# Patient Record
Sex: Female | Born: 1965
Health system: Southern US, Community
[De-identification: ages and names within clinical notes are randomized; demographics above are authoritative.]

---

## 2001-10-18 ENCOUNTER — Inpatient Hospital Stay (HOSPITAL_COMMUNITY): Admission: AD | Admit: 2001-10-18 | Discharge: 2001-10-20 | Payer: Self-pay | Admitting: Obstetrics and Gynecology

## 2001-10-21 ENCOUNTER — Encounter: Admission: RE | Admit: 2001-10-21 | Discharge: 2001-11-20 | Payer: Self-pay | Admitting: Obstetrics and Gynecology

## 2001-11-21 ENCOUNTER — Encounter: Admission: RE | Admit: 2001-11-21 | Discharge: 2001-12-21 | Payer: Self-pay | Admitting: Obstetrics and Gynecology

## 2003-01-02 ENCOUNTER — Other Ambulatory Visit: Admission: RE | Admit: 2003-01-02 | Discharge: 2003-01-02 | Payer: Self-pay | Admitting: Obstetrics and Gynecology

## 2004-04-13 ENCOUNTER — Other Ambulatory Visit: Admission: RE | Admit: 2004-04-13 | Discharge: 2004-04-13 | Payer: Self-pay | Admitting: Obstetrics and Gynecology

## 2005-06-30 ENCOUNTER — Other Ambulatory Visit: Admission: RE | Admit: 2005-06-30 | Discharge: 2005-06-30 | Payer: Self-pay | Admitting: Obstetrics and Gynecology

## 2005-07-19 ENCOUNTER — Emergency Department (HOSPITAL_COMMUNITY): Admission: EM | Admit: 2005-07-19 | Discharge: 2005-07-19 | Payer: Self-pay | Admitting: Family Medicine

## 2005-07-21 ENCOUNTER — Emergency Department (HOSPITAL_COMMUNITY): Admission: EM | Admit: 2005-07-21 | Discharge: 2005-07-21 | Payer: Self-pay | Admitting: Family Medicine

## 2005-11-05 ENCOUNTER — Emergency Department (HOSPITAL_COMMUNITY): Admission: EM | Admit: 2005-11-05 | Discharge: 2005-11-05 | Payer: Self-pay | Admitting: Family Medicine

## 2006-10-20 ENCOUNTER — Emergency Department (HOSPITAL_COMMUNITY): Admission: EM | Admit: 2006-10-20 | Discharge: 2006-10-20 | Payer: Self-pay | Admitting: Emergency Medicine

## 2007-01-18 ENCOUNTER — Emergency Department (HOSPITAL_COMMUNITY): Admission: EM | Admit: 2007-01-18 | Discharge: 2007-01-18 | Payer: Self-pay | Admitting: Emergency Medicine

## 2009-08-10 ENCOUNTER — Emergency Department (HOSPITAL_COMMUNITY): Admission: EM | Admit: 2009-08-10 | Discharge: 2009-08-10 | Payer: Self-pay | Admitting: Emergency Medicine

## 2010-03-17 ENCOUNTER — Emergency Department (HOSPITAL_COMMUNITY): Admission: EM | Admit: 2010-03-17 | Discharge: 2010-03-17 | Payer: Self-pay | Admitting: Family Medicine

## 2010-10-07 LAB — POCT RAPID STREP A (OFFICE): Streptococcus, Group A Screen (Direct): NEGATIVE

## 2010-12-09 NOTE — H&P (Signed)
[facility]  Patient:    Dawn Livingston, Dawn Livingston Visit Number: [id number] MRN: [id number]          Service Type: OBS Location: 910A 9131 01 Attending Physician:  Frederich Balding Dictated by:   Juluis Mire, M.D. Admit Date:  10/18/2001 Discharge Date: 10/20/2001                           History and Physical  CHIEF COMPLAINT:              The patient is a 45 year old gravida 3 para 1 abortus 1 married white female, who presents for repeat cesarean section.  HISTORY OF PRESENT ILLNESS:   The patients last menstrual period was January 15, 2002, giving her an estimated date of confinement of October 23, 2001.  This gives an estimated gestational age of [redacted] weeks.  This is consistent with early initial examination and prior ultrasound.                                The patients last pregnancy did result in a low transverse cesarean section for failure to progress.  We have discussed with her a trial of labor, which she has declined and now presents for repeat cesarean section.                                Other complicating issue, she was at risk for advanced maternal age.  She underwent amniocentesis with finding of chromosomal normal female.  She also had a positive group B strep with prior pregnancy, and some elevated blood pressures - although her blood pressure throughout this pregnancy has been normal.  ALLERGIES:                    No known drug allergies.  MEDICATIONS:                  Prenatal vitamins.  PAST MEDICAL HISTORY/FAMILY HISTORY/SOCIAL HISTORY:       Please see prenatal records.  REVIEW OF SYSTEMS:            Noncontributory.  PHYSICAL EXAMINATION:  VITAL SIGNS:                  The patient is afebrile with stable vital signs.  HEENT:                        Normocephalic.  PERRLA.  EOMI.  Sclerae and conjunctivae clear.  Oropharynx clear.  NECK:                         Without thyromegaly.  BREAST:                        Glandular but no discrete masses.  LUNGS:                        Clear.   CARDIAC:                     Regular rate with grade 2/6 systolic ejection murmur.  No clicks or gallops.  ABDOMEN:  Gravid uterus consistent with dates.  PELVIC:                       Cervix long and closed.  EXTREMITIES:                  Trace edema.  NEUROLOGIC:                   Deep tendon reflexes 2+, no clonus.  IMPRESSION:                   1. Intrauterine pregnancy at term with                                  prior cesarean section, desires repeat.                               2. Advanced maternal age with normal                                  amniocentesis studies.                               3. Positive group B streptococci with prior                                  pregnancy.  PLAN:                         The patient will undergo repeat cesarean section.  The risks of surgery have been discussed, including the risk of infection, the risk of hemorrhage that could necessitate transfusion with the risk of AIDS of hepatitis, the risk of injury to adjacent organs including bladder, bowel, or ureters that could require further exploratory surgery, the risk of deep vein thrombosis and pulmonary embolus.  The patient expressed understanding of the indications and risks. Dictated by:   Juluis Mire, M.D. Attending Physician:  Frederich Balding DD:  10/18/01 TD:  10/18/01 Job: [id number]

## 2010-12-09 NOTE — Op Note (Signed)
[facility]  Patient:    Dawn Livingston, Dawn Livingston Visit Number: [id number] MRN: [id number]          Service Type: OBS Location: 910A 9131 01 Attending Physician:  Frederich Balding Dictated by:   Juluis Mire, M.D. Proc. Date: 10/18/01 Admit Date:  10/18/2001                             Operative Report  PREOPERATIVE DIAGNOSIS:       Intrauterine pregnancy at term with prior cesarean section, desires a repeat.  POSTOPERATIVE DIAGNOSIS:      Intrauterine pregnancy at term with prior cesarean section, desires a repeat.  OPERATION:                    Repeat low transverse cesarean section.  SURGEON:                      Juluis Mire, M.D.  ASSISTANT:                    Trevor Iha, M.D.  ANESTHESIA:                   Spinal.  ESTIMATED BLOOD LOSS:         800 cc.  PACKS AND DRAINS:             None.  INTRAOPERATIVE BLOOD REPLACED: None.  COMPLICATIONS:                NONE.  INDICATIONS:                  Noted in history and physical.  DESCRIPTION OF PROCEDURE:     The patient was taken to the OR and placed in the supine position with left lateral tilt.  After satisfactory level of spinal anesthesia was obtained, the abdomen was prepped out with Betadine and draped in a sterile field.  A prior low transverse incision was identified and excised.  Incision was then extended to the subcutaneous tissue.  The fascia was entered sharply and incision was extended laterally.  Fascia was taken off the muscles superiorly and inferiorly.  Rectus muscles were separated in the midline.  The perineum was entered sharply and incision in the perineum extended both superiorly and inferiorly.  Low transverse bladder flap was delivered.  A low transverse uterine incision was done with a knife and extended laterally using manual traction.  The infant presented in the vertex presentation and delivered with elevation of the head and fundal pressure. The  infant was a viable female who weighed 7 pounds 6 ounces.  Apgars were 8/9.  Umbilical artery pH was 7.23.  There was a nuchal cord x1 that was easily reduced.  Amniotic fluid was clear.  Placenta was delivered manually.  The uterus was wiped free of remaining membranes and placenta.  The uterus was closed with running locking suture of 0 chromic using a two-layer closure technique.  We had good hemostasis.  Tubes and ovaries were unremarkable.  We irrigated the pelvis.  Hemostasis was excellent.  Urine output was clear and adequate.  At this point in time, muscles of the peritoneum was closed with running suture of 3-0 Vicryl.  The fascia was closed with running suture of 0 PDS.  Skin was closed with staples and Steri-Strips.  Sponge, instrument and needle counts were reported correct by the  circulating nurse x 2.  Foley catheter remained clear at the time of closure.  The patient did tolerate the procedure well and was returned to the recovery room in good condition. Dictated by:   Juluis Mire, M.D. Attending Physician:  Frederich Balding DD:  10/18/01 TD:  10/19/01 Job: [id number]

## 2010-12-09 NOTE — Discharge Summary (Signed)
[facility]  Patient:    Dawn Livingston, Dawn Livingston Visit Number: [id number] MRN: [id number]          Service Type: OBS Location: 910A 9131 01 Attending Physician:  Frederich Balding Dictated by:   Julio Sicks, N.P. Admit Date:  10/18/2001 Discharge Date: 10/20/2001                             Discharge Summary  ADMITTING DIAGNOSES:          1. Intrauterine pregnancy at term.                               2. Previous cesarean section delivery, desires                                  repeat.  DISCHARGE DIAGNOSIS:          Repeat low transverse cesarean section with                               viable female infant.  PROCEDURE:                    Repeat low transverse cesarean section.  REASON FOR ADMISSION:         Please see dictated H&P.  HOSPITAL COURSE:              The patient was admitted and taken to the operating room for the above-named procedure. A viable female infant weighing 7 pounds 6 ounces was delivered with Apgars of 8 at one minute and 9 at five minutes. Cord pH was 7.23. On postoperative day #1 the patient had a normal return of bowel function and was tolerating a liquid diet without complaints of nausea and vomiting. Hemoglobin was 11.0, hematocrit 32.6, WBCs of 9.2. On postoperative day #2 the patient was tolerating a regular diet, ambulating without assistance and voiding without difficulty. The patient requested discharged on that date.  CONDITION ON DISCHARGE:       Good.  DIET:                         Regular as tolerated.  ACTIVITY:                     No heavy lifting, no driving x two weeks, no vaginal entry.  DISCHARGE FOLLOWUP:           The patient is to follow up in the office in one to two weeks for an incision check. She is to call for temperature greater than 100 degrees, persistent nausea and vomiting, heavy vaginal bleeding, and/or redness or drainage from the incision site.  DISCHARGE MEDICATIONS:         1. Percocet 5/325.                               2. Prenatal vitamins.                               3. Sudafed p.r.n. Dictated by:   Julio Sicks, N.P. Attending Physician:  Frederich Balding  DD:  11/08/01 TD:  11/09/01 Job: [id number]

## 2011-05-10 LAB — I-STAT 8, (EC8 V) (CONVERTED LAB)
Acid-Base Excess: 1
BUN: 11
Chloride: 105
HCT: 46
Hemoglobin: 15.6 — ABNORMAL HIGH
Operator id: 282151
Potassium: 3.9
Sodium: 140

## 2011-05-10 LAB — TSH: TSH: 2.098

## 2011-07-12 ENCOUNTER — Encounter: Payer: Self-pay | Admitting: *Deleted

## 2011-07-12 ENCOUNTER — Emergency Department (INDEPENDENT_AMBULATORY_CARE_PROVIDER_SITE_OTHER)
Admission: EM | Admit: 2011-07-12 | Discharge: 2011-07-12 | Disposition: A | Discharge status: Home / Self Care | Payer: 59 | Source: Home / Self Care | Attending: Emergency Medicine | Admitting: Emergency Medicine

## 2011-07-12 DIAGNOSIS — L237 Allergic contact dermatitis due to plants, except food: Secondary | ICD-10-CM

## 2011-07-12 DIAGNOSIS — L255 Unspecified contact dermatitis due to plants, except food: Secondary | ICD-10-CM

## 2011-07-12 MED ORDER — METHYLPREDNISOLONE SODIUM SUCC 125 MG IJ SOLR
INTRAMUSCULAR | Status: AC
  Filled 2011-07-12: qty 2

## 2011-07-12 MED ORDER — BACITRACIN-POLYMYXIN B 500-10000 UNIT/GM EX OINT: TOPICAL_OINTMENT | Freq: Two times a day (BID) | CUTANEOUS | Status: AC

## 2011-07-12 MED ORDER — METHYLPREDNISOLONE SODIUM SUCC 125 MG IJ SOLR
125.0000 mg | Freq: Once | INTRAMUSCULAR | Status: AC
  Administered 2011-07-12: 125 mg via INTRAMUSCULAR

## 2011-07-12 MED ORDER — ZANFEL EX MISC: CUTANEOUS | Status: DC

## 2011-07-12 NOTE — ED Notes (Signed)
PT  HAS  RASH  ON  FACE   AND  HANDS        X   3  DAYS  HAS  HAD  REACTIONS  TO POISON IVY IN PAST        THE PT  HAS  BEEN APPLYING  CALAMINE  TO THE  AREA         AND  USING OTC  MEDS  WITHOUT  RELEIF

## 2011-07-12 NOTE — ED Provider Notes (Signed)
History     CSN: [id number] Arrival date & time: 07/12/2011  9:50 AM   First MD Initiated Contact with Patient 07/12/11 1035      Chief Complaint  Patient presents with  . Rash    HPI Comments: Pt with itchy red rash with blisters  On left hand, neck, face x 2 days after working out in yard. States is identical to prev episodes of poison ivy.   Patient is a 45 y.o. female presenting with rash. The history is provided by the patient.  Rash  This is a new problem. The current episode started more than 2 days ago. The problem is associated with plant contact. There has been no fever. The rash is present on the face, left hand and neck. The patient is experiencing no pain. Associated symptoms include itching and weeping. Pertinent negatives include no pain. She has tried antihistamines (calamaine lotion) for the symptoms. The treatment provided mild relief.    History reviewed. No pertinent past medical history.  Past Surgical History  Procedure Date  . Cesarean section     Family History  Problem Relation Age of Onset  . Diabetes Mother   . Hypertension Mother   . Diabetes Father   . Hypertension Father     History  Substance Use Topics  . Smoking status: Never Smoker   . Smokeless tobacco: Not on file  . Alcohol Use: No    OB History    Grav Para Term Preterm Abortions TAB SAB Ect Mult Living                  Review of Systems  Constitutional: Negative for fever.  HENT: Negative for trouble swallowing and voice change.   Respiratory: Negative for shortness of breath and wheezing.   Gastrointestinal: Negative for nausea and vomiting.  Skin: Positive for itching and rash.    Allergies  Review of patient's allergies indicates no known allergies.  Home Medications   Current Outpatient Rx  Name Route Sig Dispense Refill  . CALAMINE EX LOTN Topical Apply topically as needed.      Marland Kitchen CETIRIZINE HCL 10 MG PO TABS Oral Take 10 mg by mouth daily.      Marland Kitchen  BACITRACIN-POLYMYXIN B 500-10000 UNIT/GM EX OINT Topical Apply topically 2 (two) times daily. 30 g 0  . ZANFEL EX MISC  Apply per directions 30 g 0    BP 150/74  Pulse 68  Temp(Src) 98.3 F (36.8 C) (Oral)  Resp 18  SpO2 100%  LMP 07/10/2011  Physical Exam  Nursing note and vitals reviewed. Constitutional: She is oriented to person, place, and time. She appears well-developed and well-nourished. No distress.  HENT:  Head: Normocephalic and atraumatic.  Eyes: EOM are normal. Pupils are equal, round, and reactive to light.  Neck: Normal range of motion.  Cardiovascular: Regular rhythm.   Pulmonary/Chest: Effort normal and breath sounds normal.  Abdominal: She exhibits no distension.  Musculoskeletal: Normal range of motion.  Neurological: She is alert and oriented to person, place, and time.  Skin: Skin is warm and dry.       Vesicular linear rash with crusting over L hand, wrist, face, scattered lesions on neck. Erythematous urticaria under breasts that started this am per pt. No blisters.   Psychiatric: She has a normal mood and affect. Her behavior is normal. Judgment and thought content normal.    ED Course  Procedures (including critical care time)  Labs Reviewed - No data to display  No results found.   1. Contact dermatitis due to poison ivy       MDM  Giving solumedrol here sending home with zanfel and bacitracin will have continue zyrtec.   Luiz Blare, MD 07/12/11 1128

## 2012-07-09 ENCOUNTER — Other Ambulatory Visit (HOSPITAL_COMMUNITY)
Admission: RE | Admit: 2012-07-09 | Discharge: 2012-07-09 | Disposition: A | Discharge status: Home / Self Care | Payer: 59 | Source: Ambulatory Visit | Attending: Emergency Medicine | Admitting: Emergency Medicine

## 2012-07-09 ENCOUNTER — Encounter (HOSPITAL_COMMUNITY): Payer: Self-pay | Admitting: Emergency Medicine

## 2012-07-09 ENCOUNTER — Emergency Department (HOSPITAL_COMMUNITY)
Admission: EM | Admit: 2012-07-09 | Discharge: 2012-07-09 | Disposition: A | Discharge status: Home / Self Care | Payer: 59 | Source: Home / Self Care

## 2012-07-09 DIAGNOSIS — Z113 Encounter for screening for infections with a predominantly sexual mode of transmission: Secondary | ICD-10-CM | POA: Insufficient documentation

## 2012-07-09 DIAGNOSIS — R102 Pelvic and perineal pain: Secondary | ICD-10-CM

## 2012-07-09 DIAGNOSIS — N730 Acute parametritis and pelvic cellulitis: Secondary | ICD-10-CM

## 2012-07-09 DIAGNOSIS — N949 Unspecified condition associated with female genital organs and menstrual cycle: Secondary | ICD-10-CM

## 2012-07-09 DIAGNOSIS — N760 Acute vaginitis: Secondary | ICD-10-CM | POA: Insufficient documentation

## 2012-07-09 LAB — POCT URINALYSIS DIP (DEVICE)
Bilirubin Urine: NEGATIVE
Ketones, ur: NEGATIVE mg/dL
Leukocytes, UA: NEGATIVE
Nitrite: NEGATIVE
Protein, ur: NEGATIVE mg/dL
pH: 7 (ref 5.0–8.0)

## 2012-07-09 MED ORDER — LIDOCAINE HCL (PF) 1 % IJ SOLN
INTRAMUSCULAR | Status: AC
  Filled 2012-07-09: qty 5

## 2012-07-09 MED ORDER — AZITHROMYCIN 250 MG PO TABS
ORAL_TABLET | ORAL | Status: AC
  Filled 2012-07-09: qty 4

## 2012-07-09 MED ORDER — AZITHROMYCIN 250 MG PO TABS
1000.0000 mg | ORAL_TABLET | Freq: Every day | ORAL | Status: DC
  Administered 2012-07-09: 1000 mg via ORAL

## 2012-07-09 MED ORDER — CEFTRIAXONE SODIUM 250 MG IJ SOLR
INTRAMUSCULAR | Status: AC
  Filled 2012-07-09: qty 250

## 2012-07-09 MED ORDER — TRAMADOL HCL 50 MG PO TABS: 50.0000 mg | ORAL_TABLET | Freq: Four times a day (QID) | ORAL | Status: DC | PRN

## 2012-07-09 MED ORDER — CEFTRIAXONE SODIUM 250 MG IJ SOLR
250.0000 mg | Freq: Once | INTRAMUSCULAR | Status: AC
  Administered 2012-07-09: 250 mg via INTRAMUSCULAR

## 2012-07-09 NOTE — ED Notes (Signed)
Given graham crackers.

## 2012-07-09 NOTE — ED Provider Notes (Signed)
History     CSN: [id number]  Arrival date & time 07/09/12  1505   None     Chief Complaint  Patient presents with  . Abdominal Pain    (Consider location/radiation/quality/duration/timing/severity/associated sxs/prior treatment) HPI Comments: 46 year old female presents with acute pelvic pain that occurred upon awakening this morning. States had her norma/usual l bowel movement this morning but that did not change her discomfort. The pain is located across the lower pelvis but primarily in the suprapubic area. It is constant but worse with certain movements such as riding over bumps in the car. Sometimes it is better with standing. She denies associated urinary symptoms such as frequency, dysuria or urgency. She denies vaginal discharge, bleeding or vaginal pain. No nausea vomiting or diarrhea.  Her LMP was 2 weeks ago and right on time. Sexually active. Abdominal surgical history includes 2 C-sections. Denies sexual intercourse in the past 48 hours. She said nothing in the vagina for 2 weeks since her menses. She shaking, trembling and crying.   History reviewed. No pertinent past medical history.  Past Surgical History  Procedure Date  . Cesarean section     Family History  Problem Relation Age of Onset  . Diabetes Mother   . Hypertension Mother   . Diabetes Father   . Hypertension Father     History  Substance Use Topics  . Smoking status: Never Smoker   . Smokeless tobacco: Not on file  . Alcohol Use: No    OB History    Grav Para Term Preterm Abortions TAB SAB Ect Mult Living                  Review of Systems  Constitutional: Positive for activity change. Negative for fever, chills and fatigue.  HENT: Negative.   Respiratory: Negative.   Cardiovascular: Negative.   Gastrointestinal: Negative for nausea, vomiting, diarrhea, constipation, blood in stool, abdominal distention and rectal pain.  Genitourinary: Positive for pelvic pain. Negative for dysuria,  urgency, frequency, hematuria, flank pain, vaginal bleeding, vaginal discharge, difficulty urinating, vaginal pain and menstrual problem.  Musculoskeletal: Negative.   Neurological: Negative.     Allergies  Review of patient's allergies indicates no known allergies.  Home Medications   Current Outpatient Rx  Name  Route  Sig  Dispense  Refill  . CALAMINE EX LOTN   Topical   Apply topically as needed.           Marland Kitchen CETIRIZINE HCL 10 MG PO TABS   Oral   Take 10 mg by mouth daily.           Marland Kitchen ZANFEL EX MISC      Apply per directions   30 g   0   . TRAMADOL HCL 50 MG PO TABS   Oral   Take 1 tablet (50 mg total) by mouth every 6 (six) hours as needed for pain. Take one tablet every 4 hours as needed for pain   15 tablet   0     BP 167/93  Pulse 93  Temp 97.8 F (36.6 C) (Oral)  Resp 16  SpO2 100%  Physical Exam  Nursing note and vitals reviewed. Constitutional: She is oriented to person, place, and time. She appears well-developed and well-nourished.  Eyes: EOM are normal.  Neck: Normal range of motion. Neck supple.  Cardiovascular: Normal rate, regular rhythm and normal heart sounds.   Pulmonary/Chest: Effort normal and breath sounds normal. No respiratory distress. She has no wheezes. She has  no rales.  Abdominal: Soft. She exhibits no distension. There is no rebound and no guarding.       The abdomen is soft and nontender however external pelvic exam reveals guarding and severe tenderness with palpation. Patient states this could be in part due to a full bladder for she was told not to void prior to coming to the urgent care. Postvoiding exam reveals tenderness primarily to the suprapubic area. There is tenderness but less intense to the left pelvis. Palpating the external pelvis she is crying with pain and rates it a 7-8/10. No masses or hernia appreciated.   Musculoskeletal: Normal range of motion. She exhibits no edema.  Neurological: She is alert and oriented  to person, place, and time.  Skin: Skin is warm and dry. No erythema.    ED Course  Procedures (including critical care time)  Labs Reviewed  POCT URINALYSIS DIP (DEVICE) - Abnormal; Notable for the following:    Hgb urine dipstick MODERATE (*)     All other components within normal limits  POCT PREGNANCY, URINE  CERVICOVAGINAL ANCILLARY ONLY   No results found.   1. PID (acute pelvic inflammatory disease)   2. Pelvic pain in female       MDM   Results for orders placed during the hospital encounter of 07/09/12  POCT URINALYSIS DIP (DEVICE)      Component Value Range   Glucose, UA NEGATIVE  NEGATIVE mg/dL   Bilirubin Urine NEGATIVE  NEGATIVE   Ketones, ur NEGATIVE  NEGATIVE mg/dL   Specific Gravity, Urine <=1.005  1.005 - 1.030   Hgb urine dipstick MODERATE (*) NEGATIVE   pH 7.0  5.0 - 8.0   Protein, ur NEGATIVE  NEGATIVE mg/dL   Urobilinogen, UA 0.2  0.0 - 1.0 mg/dL   Nitrite NEGATIVE  NEGATIVE   Leukocytes, UA NEGATIVE  NEGATIVE  POCT PREGNANCY, URINE      Component Value Range   Preg Test, Ur NEGATIVE  NEGATIVE   Abdominal  exam is benign. Pelvic exam reveals tenderness over the external pelvis. There are no urinary symptoms. Since there is tenderness on the internal pelvic exam  this most likely represents PID. Rocephin 250 mg IM now Azithromycin 1 g by mouth now Af firm and GC Chlamydia DNA were obtained. Once results are back we will treat accordingly. Tramadol 50 mg every 4 hours when necessary pain. She was offered additional pain medicines however she declined. For any worsening, new symptoms, problems increased pain, bleeding, fever, chills, back pain vomiting or other symptoms go to the [facility] hospital or your gynecologist immediately.  07/10/12  2050h Followup telephone call to the patient. Patient states that she is feeling a little  better having to take ibuprofen for pain. She has not become any worse or develop any new symptoms. The test results  are back the GC/Chlamydia and af firm test were all negative. She has an appointment with her gynecologist tomorrow afternoon.        Hayden Rasmussen, NP 07/09/12 1609  Hayden Rasmussen, NP 07/09/12 1610  Hayden Rasmussen, NP 07/10/12 9604  Hayden Rasmussen, NP 07/10/12 2052

## 2012-07-09 NOTE — ED Notes (Signed)
C/o low abdominal pain, onset around 4 am today.  Pain never completely goes away, pain is constant.

## 2012-07-09 NOTE — ED Notes (Signed)
Patient aware of post injection delay.

## 2012-07-12 NOTE — ED Provider Notes (Signed)
Medical screening examination/treatment/procedure(s) were performed by resident physician or non-physician practitioner and as supervising physician I was immediately available for consultation/collaboration.   Fynn Vanblarcom DOUGLAS MD.    Dequavius Kuhner D Jerilynn Feldmeier, MD 07/12/12 1128 

## 2013-03-25 ENCOUNTER — Other Ambulatory Visit (HOSPITAL_COMMUNITY): Payer: Self-pay | Admitting: Internal Medicine

## 2013-03-25 DIAGNOSIS — R109 Unspecified abdominal pain: Secondary | ICD-10-CM

## 2013-03-27 ENCOUNTER — Ambulatory Visit (HOSPITAL_COMMUNITY)
Admission: RE | Admit: 2013-03-27 | Discharge: 2013-03-27 | Disposition: A | Discharge status: Home / Self Care | Payer: 59 | Source: Ambulatory Visit | Attending: Internal Medicine | Admitting: Internal Medicine

## 2013-03-27 DIAGNOSIS — R109 Unspecified abdominal pain: Secondary | ICD-10-CM | POA: Insufficient documentation

## 2013-03-27 DIAGNOSIS — K449 Diaphragmatic hernia without obstruction or gangrene: Secondary | ICD-10-CM | POA: Insufficient documentation

## 2014-05-20 ENCOUNTER — Other Ambulatory Visit: Payer: Self-pay | Admitting: Obstetrics and Gynecology

## 2014-05-22 LAB — CYTOLOGY - PAP

## 2016-01-10 DIAGNOSIS — Z01419 Encounter for gynecological examination (general) (routine) without abnormal findings: Secondary | ICD-10-CM | POA: Diagnosis not present

## 2016-01-10 DIAGNOSIS — Z1231 Encounter for screening mammogram for malignant neoplasm of breast: Secondary | ICD-10-CM | POA: Diagnosis not present

## 2016-01-10 DIAGNOSIS — Z6832 Body mass index (BMI) 32.0-32.9, adult: Secondary | ICD-10-CM | POA: Diagnosis not present

## 2016-01-12 DIAGNOSIS — L237 Allergic contact dermatitis due to plants, except food: Secondary | ICD-10-CM | POA: Diagnosis not present

## 2016-01-12 DIAGNOSIS — L0390 Cellulitis, unspecified: Secondary | ICD-10-CM | POA: Diagnosis not present

## 2016-01-12 MED FILL — CEPHALEXIN 500 MG CAPSULE: 500 | 10 days supply | Qty: 20 | Fill #0

## 2016-01-12 MED FILL — predniSONE 20 MG TABS: 20 | 9 days supply | Qty: 18 | Fill #0

## 2016-10-30 DIAGNOSIS — H524 Presbyopia: Secondary | ICD-10-CM | POA: Diagnosis not present

## 2017-09-04 DIAGNOSIS — Z01419 Encounter for gynecological examination (general) (routine) without abnormal findings: Secondary | ICD-10-CM | POA: Diagnosis not present

## 2017-09-04 DIAGNOSIS — Z6835 Body mass index (BMI) 35.0-35.9, adult: Secondary | ICD-10-CM | POA: Diagnosis not present

## 2017-09-04 DIAGNOSIS — Z1231 Encounter for screening mammogram for malignant neoplasm of breast: Secondary | ICD-10-CM | POA: Diagnosis not present

## 2017-10-09 ENCOUNTER — Encounter: Payer: Self-pay | Admitting: Obstetrics and Gynecology

## 2018-02-26 DIAGNOSIS — H524 Presbyopia: Secondary | ICD-10-CM | POA: Diagnosis not present

## 2018-09-23 DIAGNOSIS — Z8249 Family history of ischemic heart disease and other diseases of the circulatory system: Secondary | ICD-10-CM | POA: Diagnosis not present

## 2018-09-23 DIAGNOSIS — Z01419 Encounter for gynecological examination (general) (routine) without abnormal findings: Secondary | ICD-10-CM | POA: Diagnosis not present

## 2018-09-23 DIAGNOSIS — N951 Menopausal and female climacteric states: Secondary | ICD-10-CM | POA: Diagnosis not present

## 2018-09-23 DIAGNOSIS — Z833 Family history of diabetes mellitus: Secondary | ICD-10-CM | POA: Diagnosis not present

## 2018-09-23 DIAGNOSIS — Z13220 Encounter for screening for lipoid disorders: Secondary | ICD-10-CM | POA: Diagnosis not present

## 2018-09-23 DIAGNOSIS — Z13228 Encounter for screening for other metabolic disorders: Secondary | ICD-10-CM | POA: Diagnosis not present

## 2018-09-23 DIAGNOSIS — Z1329 Encounter for screening for other suspected endocrine disorder: Secondary | ICD-10-CM | POA: Diagnosis not present

## 2018-09-23 DIAGNOSIS — Z6835 Body mass index (BMI) 35.0-35.9, adult: Secondary | ICD-10-CM | POA: Diagnosis not present

## 2018-09-23 DIAGNOSIS — Z1231 Encounter for screening mammogram for malignant neoplasm of breast: Secondary | ICD-10-CM | POA: Diagnosis not present

## 2018-09-23 DIAGNOSIS — Z1321 Encounter for screening for nutritional disorder: Secondary | ICD-10-CM | POA: Diagnosis not present

## 2018-09-25 ENCOUNTER — Other Ambulatory Visit: Payer: Self-pay | Admitting: Obstetrics and Gynecology

## 2018-09-25 DIAGNOSIS — R928 Other abnormal and inconclusive findings on diagnostic imaging of breast: Secondary | ICD-10-CM

## 2018-09-30 ENCOUNTER — Ambulatory Visit
Admission: RE | Admit: 2018-09-30 | Discharge: 2018-09-30 | Disposition: A | Discharge status: Home / Self Care | Payer: 59 | Source: Ambulatory Visit | Attending: Obstetrics and Gynecology | Admitting: Obstetrics and Gynecology

## 2018-09-30 DIAGNOSIS — N6002 Solitary cyst of left breast: Secondary | ICD-10-CM | POA: Diagnosis not present

## 2018-09-30 DIAGNOSIS — R928 Other abnormal and inconclusive findings on diagnostic imaging of breast: Secondary | ICD-10-CM

## 2018-09-30 DIAGNOSIS — R922 Inconclusive mammogram: Secondary | ICD-10-CM | POA: Diagnosis not present

## 2018-11-05 DIAGNOSIS — E559 Vitamin D deficiency, unspecified: Secondary | ICD-10-CM | POA: Diagnosis not present

## 2018-11-13 ENCOUNTER — Telehealth: Payer: 59 | Admitting: Family

## 2018-11-13 DIAGNOSIS — L255 Unspecified contact dermatitis due to plants, except food: Secondary | ICD-10-CM | POA: Diagnosis not present

## 2018-11-13 MED ORDER — PREDNISONE 10 MG (21) PO TBPK: ORAL_TABLET | ORAL | 0 refills | Status: DC

## 2018-11-13 MED FILL — predniSONE 10 MG (21) TBPK: 10 | 6 days supply | Qty: 21 | Fill #0

## 2018-11-13 NOTE — Progress Notes (Signed)

## 2019-03-16 DIAGNOSIS — S61412A Laceration without foreign body of left hand, initial encounter: Secondary | ICD-10-CM | POA: Diagnosis not present

## 2019-03-16 DIAGNOSIS — W540XXA Bitten by dog, initial encounter: Secondary | ICD-10-CM | POA: Diagnosis not present

## 2019-03-18 MED FILL — ETODOLAC 400 MG TABS: 400 | 8 days supply | Qty: 12 | Fill #0

## 2019-03-22 MED FILL — CEFADROXIL 500 MG CAPSULE: 500 | 5 days supply | Qty: 10 | Fill #0

## 2019-03-27 DIAGNOSIS — S61411A Laceration without foreign body of right hand, initial encounter: Secondary | ICD-10-CM | POA: Diagnosis not present

## 2019-03-27 DIAGNOSIS — W540XXA Bitten by dog, initial encounter: Secondary | ICD-10-CM | POA: Diagnosis not present

## 2020-02-04 DIAGNOSIS — Z1231 Encounter for screening mammogram for malignant neoplasm of breast: Secondary | ICD-10-CM | POA: Diagnosis not present

## 2020-03-12 IMAGING — MG DIGITAL DIAGNOSTIC UNILATERAL LEFT MAMMOGRAM WITH TOMO AND CAD
4 series · 4 of 12 positions shown · non-contrast
Comparison: 09/04/2017 and earlier

CLINICAL DATA: The patient returns after screening study for
evaluation of possible LEFT breast asymmetry.

EXAM:
DIGITAL DIAGNOSTIC LEFT MAMMOGRAM WITH CAD AND TOMO
ULTRASOUND LEFT BREAST

[L CC synth-2D]
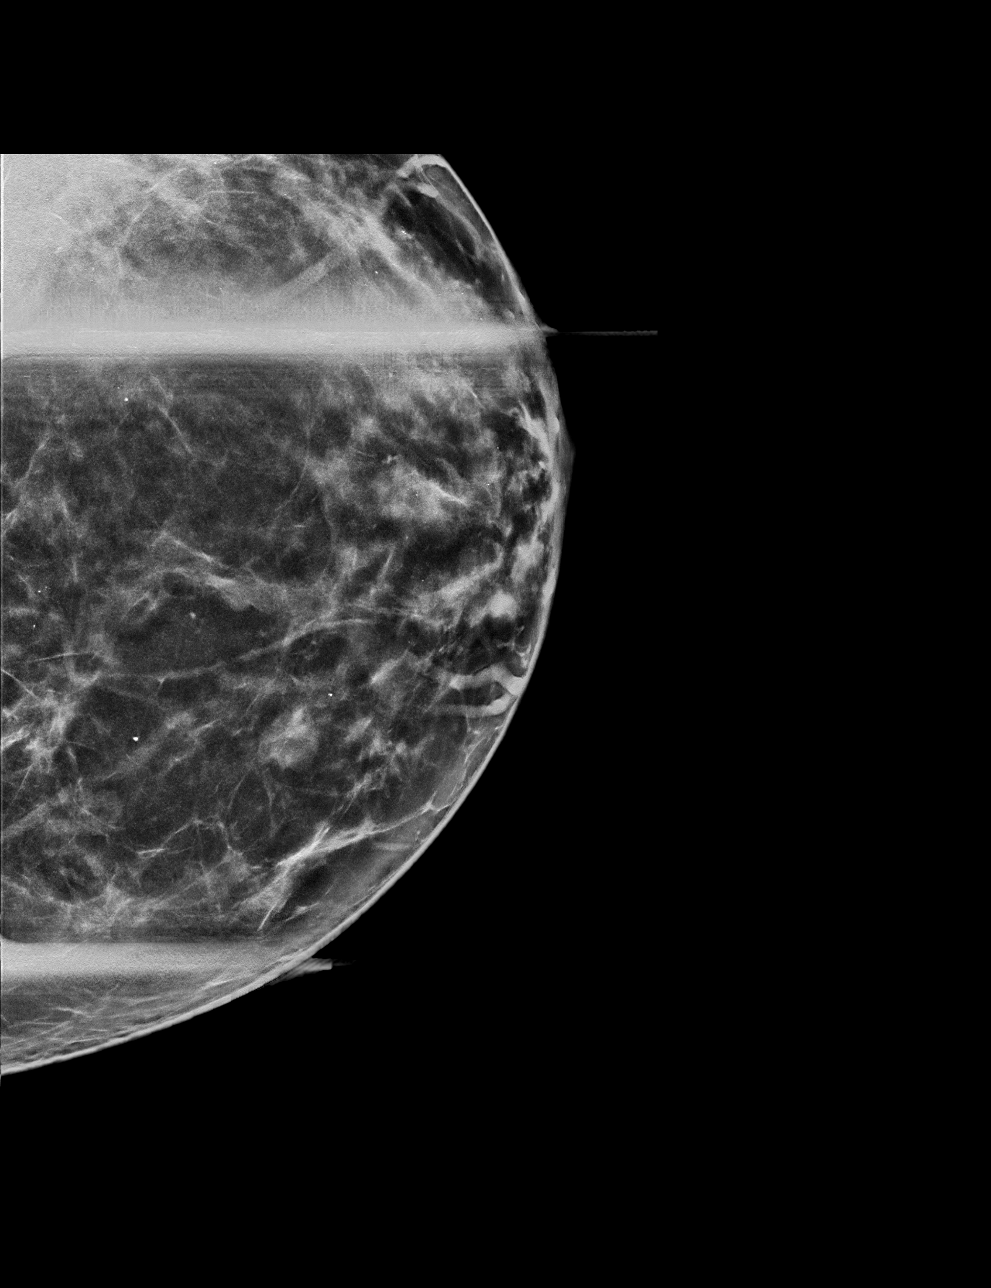

[L ML synth-2D]
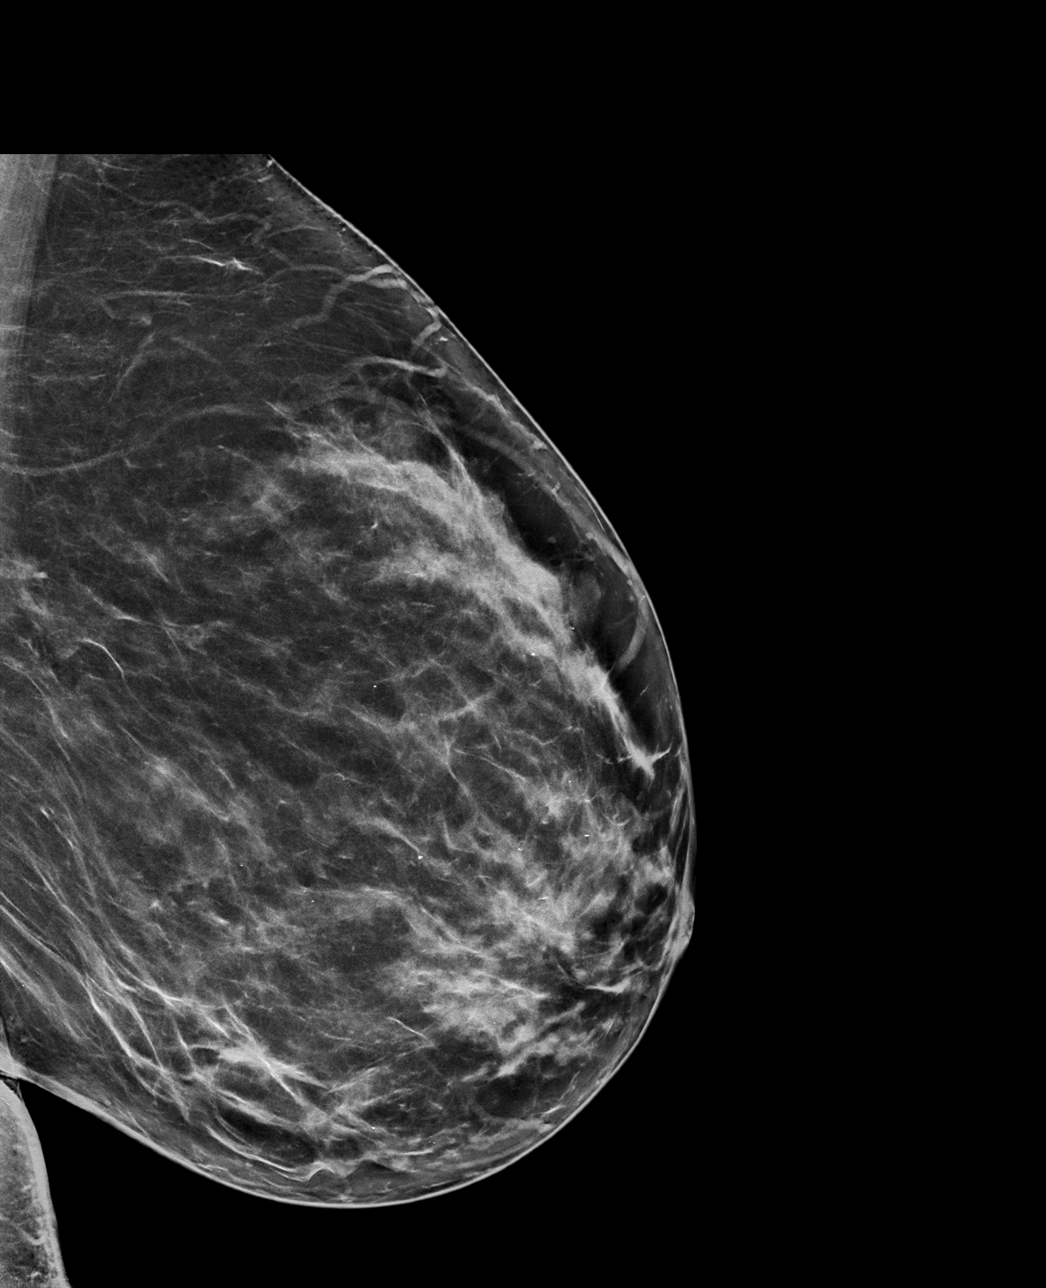

[L CC tomo · tomo slice 35/69.0]
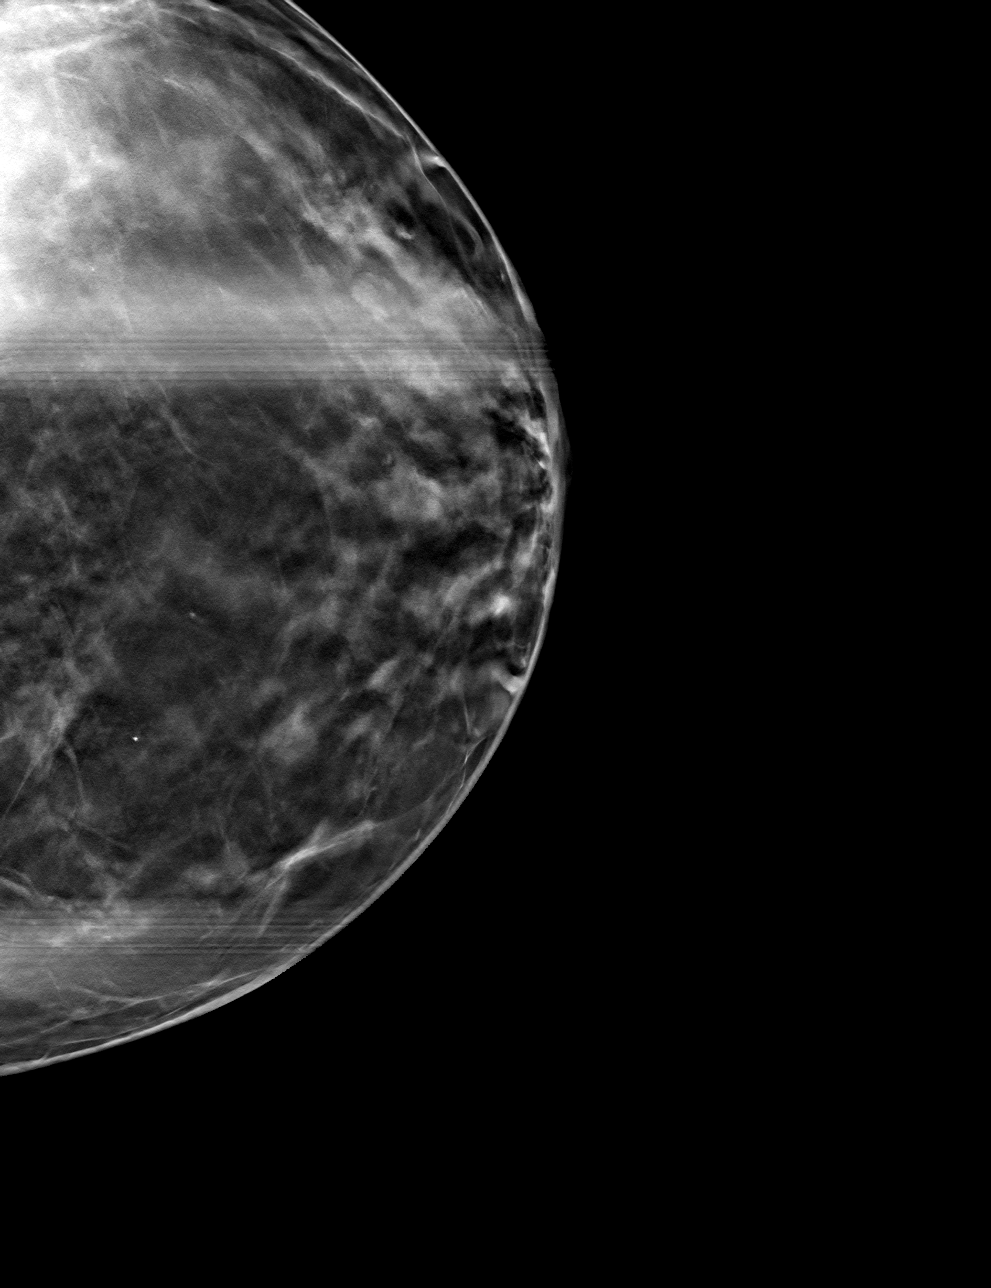

[L ML tomo · tomo slice 43/85.0]
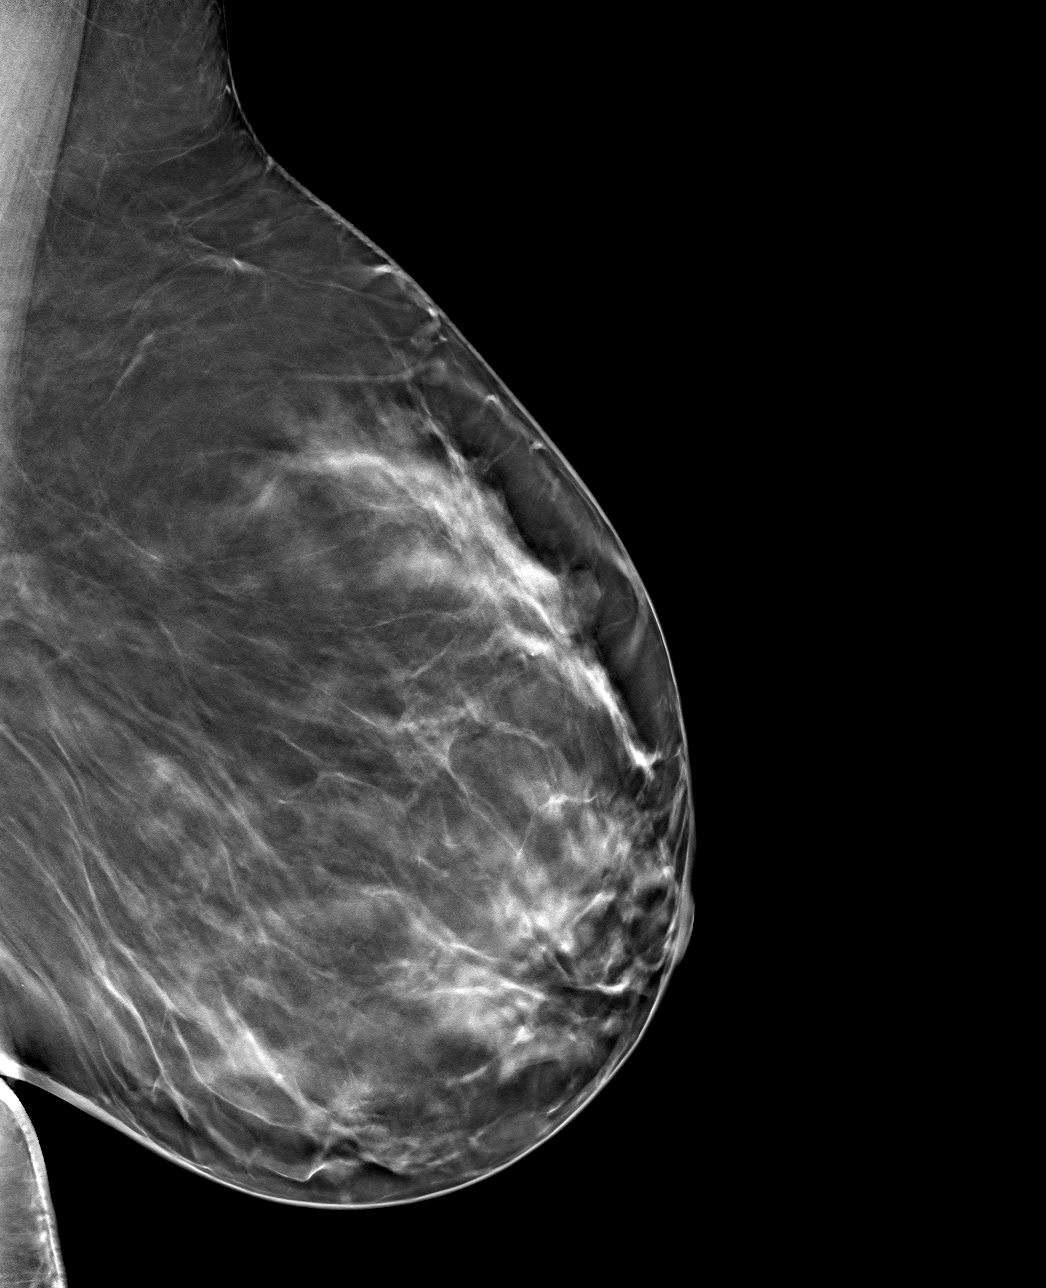

[4 of 12 positions shown; findings below may reference images not displayed]

ACR Breast Density Category c: The breast tissue is heterogeneously
dense, which may obscure small masses.
FINDINGS: Additional 2-D and 3-D images are performed. These views demonstrate
persistent circumscribed oval mass MEDIAL to LEFT nipple within the
LOWER INNER QUADRANT.

Mammographic images were processed with CAD.

Targeted ultrasound is performed, showing a circumscribed anechoic
oval mass in the 8 o'clock location of the LEFT breast 5 centimeters
from the nipple measuring 0.9 x 0.7 x 0.3 centimeters. A simple cyst
in the 12 o'clock location of the LEFT breast 4 centimeters from
nipple is 1.0 x 0.9 by 1.1 centimeters. No solid mass or areas of
acoustic shadowing.
IMPRESSION: Simple cysts within the LEFT breast, 1 of which accounts for the
mammographic abnormality. No mammographic or ultrasound evidence for
malignancy.

RECOMMENDATION:
Screening mammogram in one year.(Code:1W-[id number])

I have discussed the findings and recommendations with the patient
and her husband. Results were also provided in writing at the
conclusion of the visit. If applicable, a reminder letter will be
sent to the patient regarding the next appointment.

BI-RADS CATEGORY  2: Benign.

## 2020-03-12 IMAGING — US ULTRASOUND LEFT BREAST LIMITED
1 series · 13 of 15 positions shown · non-contrast
Comparison: 09/04/2017 and earlier

CLINICAL DATA: The patient returns after screening study for
evaluation of possible LEFT breast asymmetry.

EXAM:
DIGITAL DIAGNOSTIC LEFT MAMMOGRAM WITH CAD AND TOMO
ULTRASOUND LEFT BREAST

[Series 1: ultrasound left breast limited · 0.07mm/px · 13 of 15 slices shown]
[im 1/15]
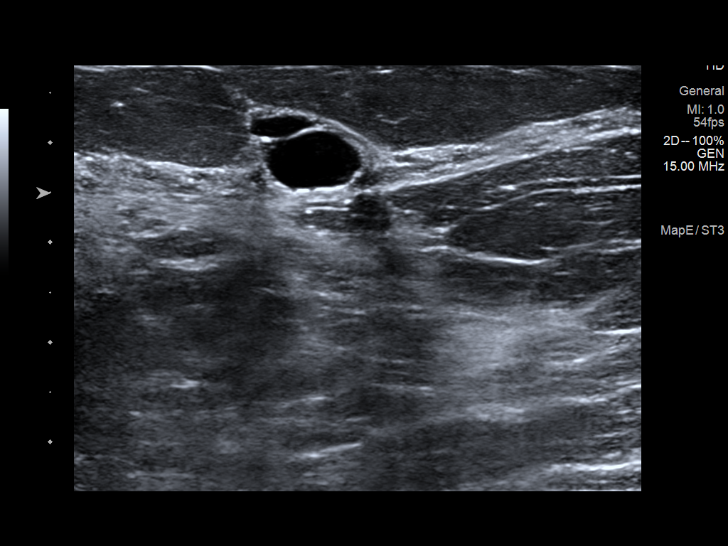
[im 2/15]
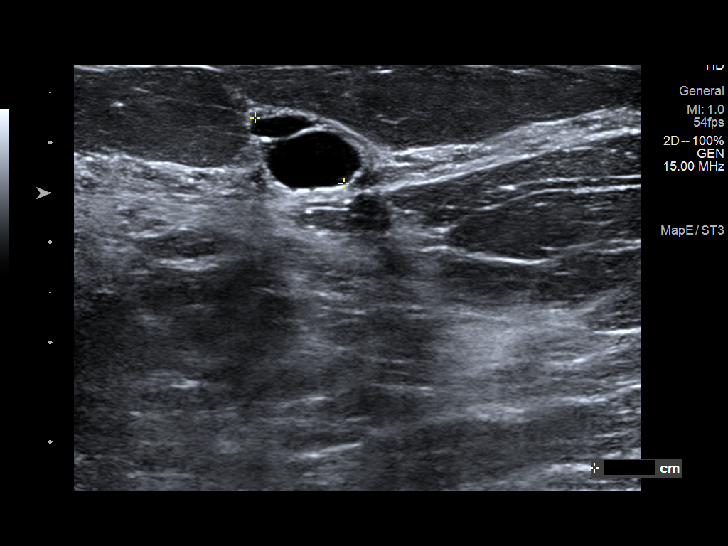
[im 3/15]
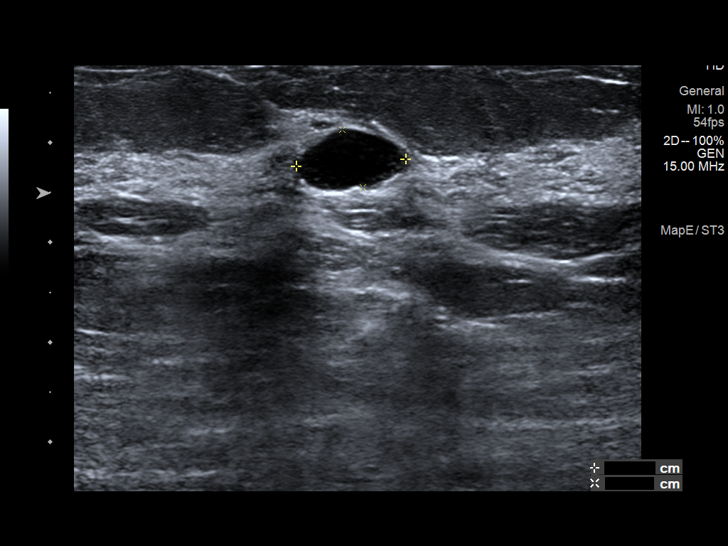
[im 5/15]
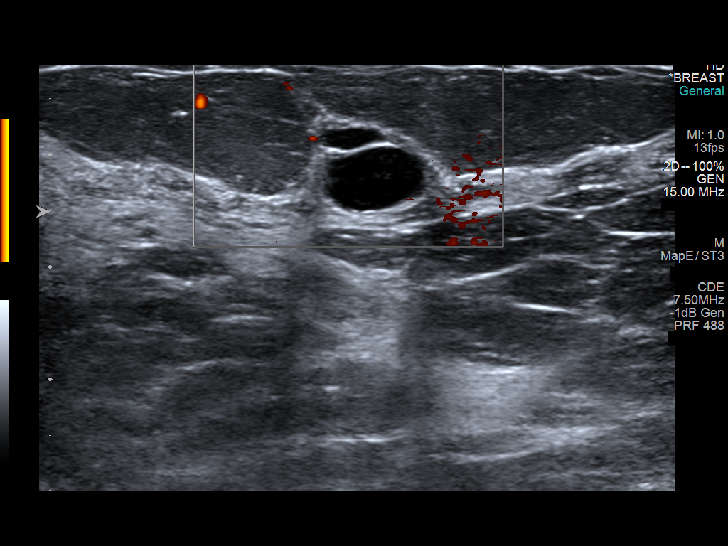
[im 6/15]
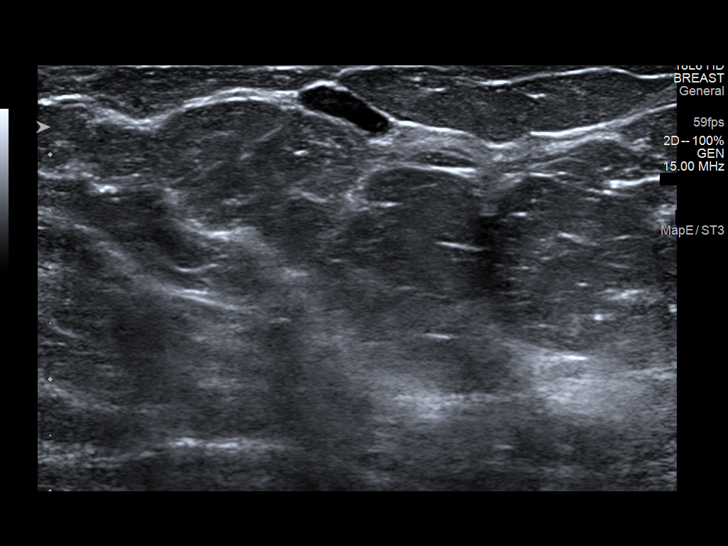
[im 7/15]
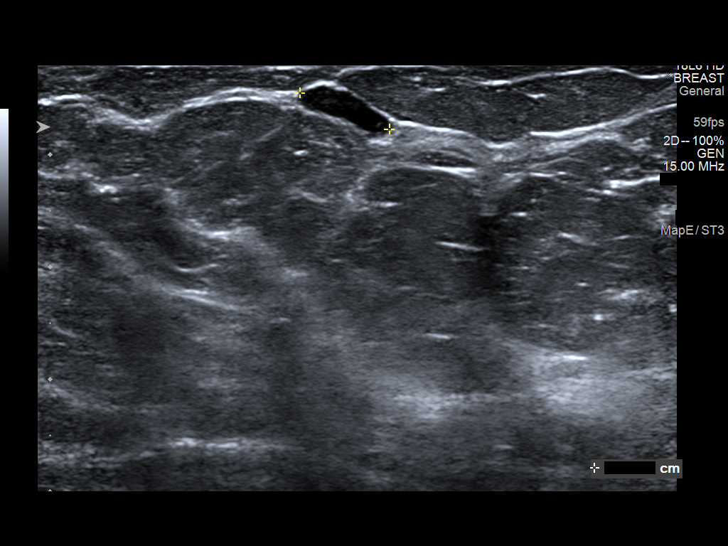
[im 8/15]
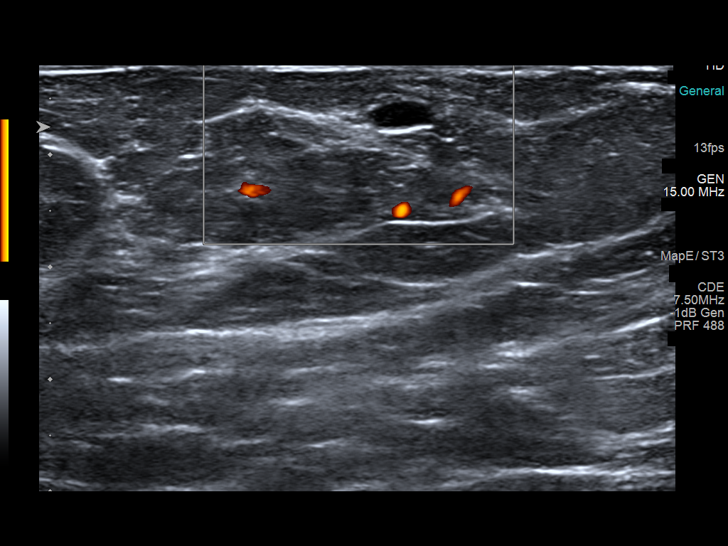
[im 9/15]
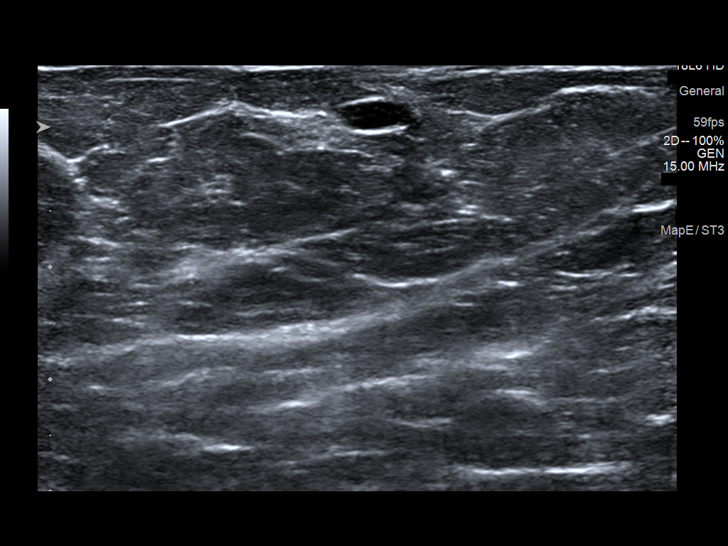
[im 10/15]
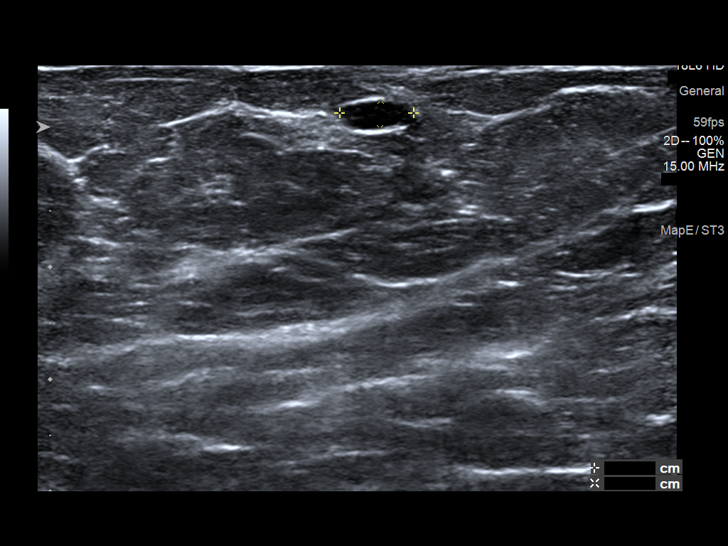
[im 11/15]
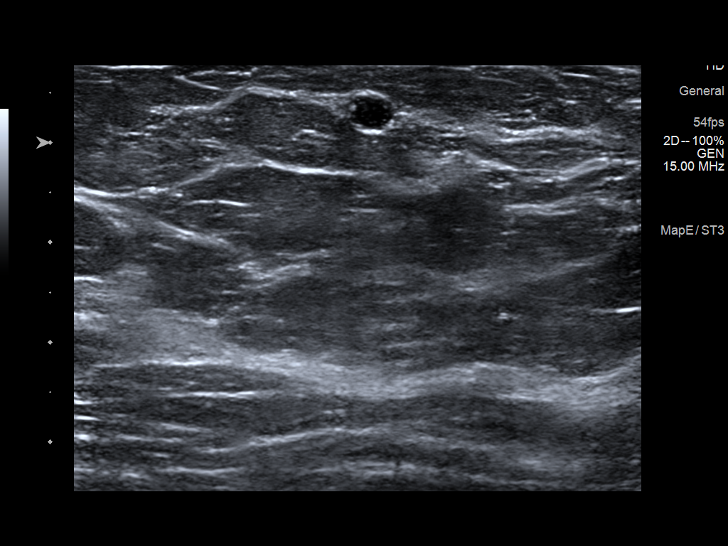
[im 13/15]
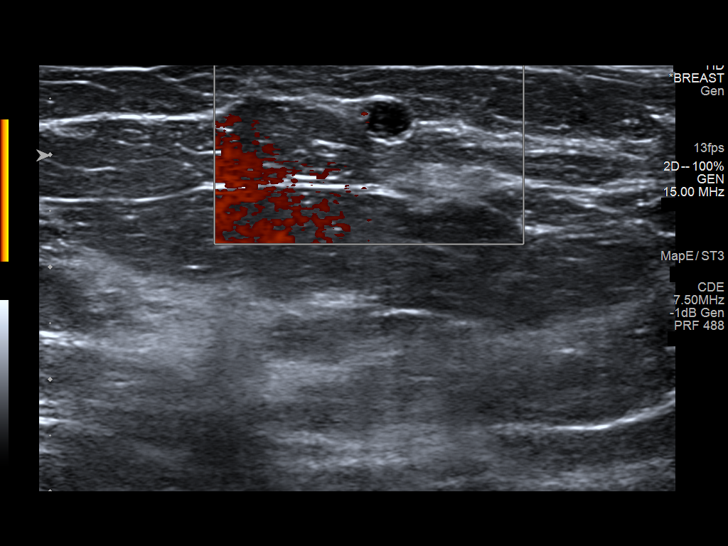
[im 14/15]
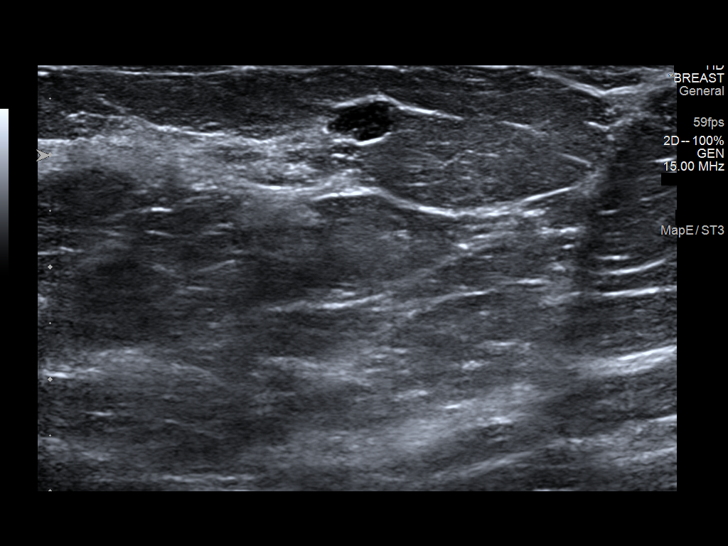
[im 15/15]
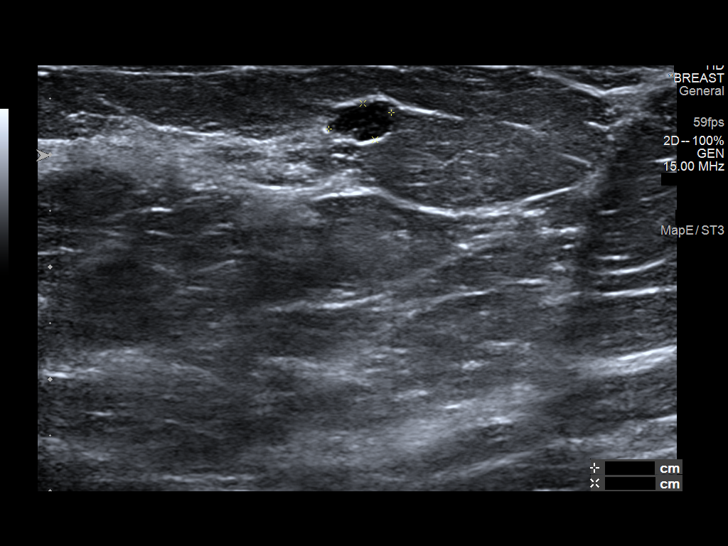

[13 of 15 positions shown; findings below may reference images not displayed]

ACR Breast Density Category c: The breast tissue is heterogeneously
dense, which may obscure small masses.
FINDINGS: Additional 2-D and 3-D images are performed. These views demonstrate
persistent circumscribed oval mass MEDIAL to LEFT nipple within the
LOWER INNER QUADRANT.

Mammographic images were processed with CAD.

Targeted ultrasound is performed, showing a circumscribed anechoic
oval mass in the 8 o'clock location of the LEFT breast 5 centimeters
from the nipple measuring 0.9 x 0.7 x 0.3 centimeters. A simple cyst
in the 12 o'clock location of the LEFT breast 4 centimeters from
nipple is 1.0 x 0.9 by 1.1 centimeters. No solid mass or areas of
acoustic shadowing.
IMPRESSION: Simple cysts within the LEFT breast, 1 of which accounts for the
mammographic abnormality. No mammographic or ultrasound evidence for
malignancy.

RECOMMENDATION:
Screening mammogram in one year.(Code:[id number])

I have discussed the findings and recommendations with the patient
and her husband. Results were also provided in writing at the
conclusion of the visit. If applicable, a reminder letter will be
sent to the patient regarding the next appointment.

BI-RADS CATEGORY  2: Benign.

## 2020-04-13 DIAGNOSIS — Z6832 Body mass index (BMI) 32.0-32.9, adult: Secondary | ICD-10-CM | POA: Diagnosis not present

## 2020-04-13 DIAGNOSIS — Z131 Encounter for screening for diabetes mellitus: Secondary | ICD-10-CM | POA: Diagnosis not present

## 2020-04-13 DIAGNOSIS — Z1321 Encounter for screening for nutritional disorder: Secondary | ICD-10-CM | POA: Diagnosis not present

## 2020-04-13 DIAGNOSIS — Z13220 Encounter for screening for lipoid disorders: Secondary | ICD-10-CM | POA: Diagnosis not present

## 2020-04-13 DIAGNOSIS — Z13228 Encounter for screening for other metabolic disorders: Secondary | ICD-10-CM | POA: Diagnosis not present

## 2020-04-13 DIAGNOSIS — Z1329 Encounter for screening for other suspected endocrine disorder: Secondary | ICD-10-CM | POA: Diagnosis not present

## 2020-04-13 DIAGNOSIS — Z01419 Encounter for gynecological examination (general) (routine) without abnormal findings: Secondary | ICD-10-CM | POA: Diagnosis not present

## 2021-03-23 ENCOUNTER — Other Ambulatory Visit (HOSPITAL_COMMUNITY): Payer: Self-pay

## 2021-03-23 DIAGNOSIS — L237 Allergic contact dermatitis due to plants, except food: Secondary | ICD-10-CM | POA: Diagnosis not present

## 2021-03-23 DIAGNOSIS — R030 Elevated blood-pressure reading, without diagnosis of hypertension: Secondary | ICD-10-CM | POA: Diagnosis not present

## 2021-03-23 MED ORDER — MUPIROCIN 2 % EX OINT
TOPICAL_OINTMENT | CUTANEOUS | 0 refills | Status: DC
  Filled 2021-03-23: qty 22, 14d supply, fill #0

## 2021-03-23 MED ORDER — HYDROCORTISONE 2.5 % EX CREA
TOPICAL_CREAM | CUTANEOUS | 0 refills | Status: DC
  Filled 2021-03-23: qty 30, 14d supply, fill #0

## 2021-03-23 MED ORDER — CEPHALEXIN 500 MG PO CAPS
ORAL_CAPSULE | ORAL | 0 refills | Status: DC
  Filled 2021-03-23: qty 14, 7d supply, fill #0

## 2021-05-31 DIAGNOSIS — Z1231 Encounter for screening mammogram for malignant neoplasm of breast: Secondary | ICD-10-CM | POA: Diagnosis not present

## 2021-06-07 DIAGNOSIS — Z1321 Encounter for screening for nutritional disorder: Secondary | ICD-10-CM | POA: Diagnosis not present

## 2021-06-07 DIAGNOSIS — Z1329 Encounter for screening for other suspected endocrine disorder: Secondary | ICD-10-CM | POA: Diagnosis not present

## 2021-06-07 DIAGNOSIS — Z13228 Encounter for screening for other metabolic disorders: Secondary | ICD-10-CM | POA: Diagnosis not present

## 2021-06-07 DIAGNOSIS — Z6834 Body mass index (BMI) 34.0-34.9, adult: Secondary | ICD-10-CM | POA: Diagnosis not present

## 2021-06-07 DIAGNOSIS — Z01419 Encounter for gynecological examination (general) (routine) without abnormal findings: Secondary | ICD-10-CM | POA: Diagnosis not present

## 2021-06-07 DIAGNOSIS — Z13220 Encounter for screening for lipoid disorders: Secondary | ICD-10-CM | POA: Diagnosis not present

## 2021-06-07 DIAGNOSIS — N951 Menopausal and female climacteric states: Secondary | ICD-10-CM | POA: Diagnosis not present

## 2021-06-07 DIAGNOSIS — Z8249 Family history of ischemic heart disease and other diseases of the circulatory system: Secondary | ICD-10-CM | POA: Diagnosis not present

## 2021-06-27 ENCOUNTER — Other Ambulatory Visit: Payer: Self-pay | Admitting: Obstetrics and Gynecology

## 2021-06-27 DIAGNOSIS — E785 Hyperlipidemia, unspecified: Secondary | ICD-10-CM

## 2021-08-10 DIAGNOSIS — E559 Vitamin D deficiency, unspecified: Secondary | ICD-10-CM | POA: Diagnosis not present

## 2021-12-29 DIAGNOSIS — H52223 Regular astigmatism, bilateral: Secondary | ICD-10-CM | POA: Diagnosis not present

## 2021-12-29 DIAGNOSIS — H5211 Myopia, right eye: Secondary | ICD-10-CM | POA: Diagnosis not present

## 2021-12-29 DIAGNOSIS — H5202 Hypermetropia, left eye: Secondary | ICD-10-CM | POA: Diagnosis not present

## 2022-08-31 DIAGNOSIS — Z124 Encounter for screening for malignant neoplasm of cervix: Secondary | ICD-10-CM | POA: Diagnosis not present

## 2022-08-31 DIAGNOSIS — Z13220 Encounter for screening for lipoid disorders: Secondary | ICD-10-CM | POA: Diagnosis not present

## 2022-08-31 DIAGNOSIS — Z1321 Encounter for screening for nutritional disorder: Secondary | ICD-10-CM | POA: Diagnosis not present

## 2022-08-31 DIAGNOSIS — Z131 Encounter for screening for diabetes mellitus: Secondary | ICD-10-CM | POA: Diagnosis not present

## 2022-08-31 DIAGNOSIS — Z6832 Body mass index (BMI) 32.0-32.9, adult: Secondary | ICD-10-CM | POA: Diagnosis not present

## 2022-08-31 DIAGNOSIS — Z1329 Encounter for screening for other suspected endocrine disorder: Secondary | ICD-10-CM | POA: Diagnosis not present

## 2022-08-31 DIAGNOSIS — Z01419 Encounter for gynecological examination (general) (routine) without abnormal findings: Secondary | ICD-10-CM | POA: Diagnosis not present

## 2022-08-31 DIAGNOSIS — Z13228 Encounter for screening for other metabolic disorders: Secondary | ICD-10-CM | POA: Diagnosis not present

## 2022-08-31 DIAGNOSIS — Z1151 Encounter for screening for human papillomavirus (HPV): Secondary | ICD-10-CM | POA: Diagnosis not present

## 2022-09-04 ENCOUNTER — Other Ambulatory Visit: Payer: Self-pay | Admitting: Obstetrics and Gynecology

## 2022-09-04 DIAGNOSIS — Z8249 Family history of ischemic heart disease and other diseases of the circulatory system: Secondary | ICD-10-CM

## 2022-09-08 ENCOUNTER — Other Ambulatory Visit (HOSPITAL_COMMUNITY): Payer: Self-pay

## 2022-09-12 DIAGNOSIS — E669 Obesity, unspecified: Secondary | ICD-10-CM | POA: Diagnosis not present

## 2022-09-12 DIAGNOSIS — Z1231 Encounter for screening mammogram for malignant neoplasm of breast: Secondary | ICD-10-CM | POA: Diagnosis not present

## 2022-09-12 DIAGNOSIS — Z1211 Encounter for screening for malignant neoplasm of colon: Secondary | ICD-10-CM | POA: Diagnosis not present

## 2022-09-28 DIAGNOSIS — Z13820 Encounter for screening for osteoporosis: Secondary | ICD-10-CM | POA: Diagnosis not present

## 2022-11-10 ENCOUNTER — Other Ambulatory Visit (HOSPITAL_COMMUNITY): Payer: Self-pay

## 2022-11-10 MED ORDER — GOLYTELY 236 G PO SOLR
ORAL | 0 refills | Status: DC
  Filled 2022-11-10: qty 4000, 1d supply, fill #0

## 2022-11-13 ENCOUNTER — Other Ambulatory Visit (HOSPITAL_COMMUNITY): Payer: Self-pay

## 2022-11-22 DIAGNOSIS — Z1211 Encounter for screening for malignant neoplasm of colon: Secondary | ICD-10-CM | POA: Diagnosis not present

## 2022-11-22 DIAGNOSIS — K5730 Diverticulosis of large intestine without perforation or abscess without bleeding: Secondary | ICD-10-CM | POA: Diagnosis not present

## 2022-11-22 DIAGNOSIS — K648 Other hemorrhoids: Secondary | ICD-10-CM | POA: Diagnosis not present

## 2023-07-30 ENCOUNTER — Ambulatory Visit
Admission: RE | Admit: 2023-07-30 | Discharge: 2023-07-30 | Disposition: A | Discharge status: Home / Self Care | Payer: No Typology Code available for payment source | Source: Ambulatory Visit | Attending: Obstetrics and Gynecology | Admitting: Obstetrics and Gynecology

## 2023-07-30 DIAGNOSIS — Z8249 Family history of ischemic heart disease and other diseases of the circulatory system: Secondary | ICD-10-CM

## 2023-11-07 ENCOUNTER — Telehealth: Payer: Self-pay | Admitting: Physician Assistant

## 2023-11-07 ENCOUNTER — Other Ambulatory Visit (HOSPITAL_COMMUNITY): Payer: Self-pay

## 2023-11-07 DIAGNOSIS — L237 Allergic contact dermatitis due to plants, except food: Secondary | ICD-10-CM | POA: Diagnosis not present

## 2023-11-07 MED ORDER — TRIAMCINOLONE ACETONIDE 0.1 % EX CREA
1.0000 | TOPICAL_CREAM | Freq: Two times a day (BID) | CUTANEOUS | 0 refills | Status: DC
  Filled 2023-11-07: qty 30, 15d supply, fill #0

## 2023-11-07 MED ORDER — PREDNISONE 10 MG PO TABS
ORAL_TABLET | ORAL | 0 refills | Status: AC
  Filled 2023-11-07: qty 37, 14d supply, fill #0

## 2023-11-07 NOTE — Progress Notes (Signed)
 E-Visit for [employer]  We are sorry that you are not feeing well.  Here is how we plan to help!  Based on what you have shared with me it looks like you have had an allergic reaction to the oily resin from a group of plants.  This resin is very sticky, so it easily attaches to your skin, clothing, tools equipment, and pet's fur.    This blistering rash is often called poison ivy rash although it can come from contact with the leaves, stems and roots of poison ivy, poison oak and poison sumac.  The oily resin contains urushiol (u-ROO-she-ol) that produces a skin rash on exposed skin.  The severity of the rash depends on the amount of urushiol that gets on your skin.  A section of skin with more urushiol on it may develop a rash sooner.  The rash usually develops 12-48 hours after exposure and can last two to three weeks.  Your skin must come in direct contact with the plant's oil to be affected.  Blister fluid doesn't spread the rash.  However, if you come into contact with a piece of clothing or pet fur that has urushiol on it, the rash may spread out.  You can also transfer the oil to other parts of your body with your fingers.  Often the rash looks like a straight line because of the way the plant brushes against your skin.  Since your rash is widespread or has resulted in a large number of blisters, I have prescribed an oral corticosteroid.  Please follow these recommendations:  I have sent a prednisone dose pack to your chosen pharmacy. Be sure to follow the instructions carefully and complete the entire prescription. You may use Benadryl or Caladryl topical lotions to sooth the itch and remember cool, not hot, showers and baths can help relieve the itching!  Place cool, wet compresses on the affected area for 15-30 minutes several times a day.  You may also take oral antihistamines, such as diphenhydramine (Benadryl, others), which may also help you sleep better.  Watch your skin for any purulent  (pus) drainage or red streaking from the site.  If this occurs, contact your provider.  You may require an antibiotic for a skin infection.  Make sure that the clothes you were wearing as well as any towels or sheets that may have come in contact with the oil (urushiol) are washed in detergent and hot water.       I have developed the following plan to treat your condition I am prescribing a two week course of steroids (37 tablets of 10 mg prednisone).  Days 1-4 take 4 tablets (40 mg) daily  Days 5-8 take 3 tablets (30 mg) daily, Days 9-11 take 2 tablets (20 mg) daily, Days 12-14 take 1 tablet (10 mg) daily.  I have also prescribed a topical Triamcinolone cream to apply as directed.   What can you do to prevent this rash?  Avoid the plants.  Learn how to identify poison ivy, poison oak and poison sumac in all seasons.  When hiking or engaging in other activities that might expose you to these plants, try to stay on cleared pathways.  If camping, make sure you pitch your tent in an area free of these plants.  Keep pets from running through wooded areas so that urushiol doesn't accidentally stick to their fur, which you may touch.  Remove or kill the plants.  In your yard, you can get rid  of poison ivy by applying an herbicide or pulling it out of the ground, including the roots, while wearing heavy gloves.  Afterward remove the gloves and thoroughly wash them and your hands.  Don't burn poison ivy or related plants because the urushiol can be carried by smoke.  Wear protective clothing.  If needed, protect your skin by wearing socks, boots, pants, long sleeves and vinyl gloves.  Wash your skin right away.  Washing off the oil with soap and water within 30 minutes of exposure may reduce your chances of getting a poison ivy rash.  Even washing after an hour or so can help reduce the severity of the rash.  If you walk through some poison ivy and then later touch your shoes, you may get some urushiol on  your hands, which may then transfer to your face or body by touching or rubbing.  If the contaminated object isn't cleaned, the urushiol on it can still cause a skin reaction years later.    Be careful not to reuse towels after you have washed your skin.  Also carefully wash clothing in detergent and hot water to remove all traces of the oil.  Handle contaminated clothing carefully so you don't transfer the urushiol to yourself, furniture, rugs or appliances.  Remember that pets can carry the oil on their fur and paws.  If you think your pet may be contaminated with urushiol, put on some long rubber gloves and give your pet a bath.  Finally, be careful not to burn these plants as the smoke can contain traces of the oil.  Inhaling the smoke may result in difficulty breathing. If that occurred you should see a physician as soon as possible.  See your doctor right away if:  The reaction is severe or widespread You inhaled the smoke from burning poison ivy and are having difficulty breathing Your skin continues to swell The rash affects your eyes, mouth or genitals Blisters are oozing pus You develop a fever greater than 100 F (37.8 C) The rash doesn't get better within a few weeks.  If you scratch the poison ivy rash, bacteria under your fingernails may cause the skin to become infected.  See your doctor if pus starts oozing from the blisters.  Treatment generally includes antibiotics.  Poison ivy treatments are usually limited to self-care methods.  And the rash typically goes away on its own in two to three weeks.     If the rash is widespread or results in a large number of blisters, your doctor may prescribe an oral corticosteroid, such as prednisone.  If a bacterial infection has developed at the rash site, your doctor may give you a prescription for an oral antibiotic.  MAKE SURE YOU  Understand these instructions. Will watch your condition. Will get help right away if you are not  doing well or get worse.   Thank you for choosing an e-visit.  Your e-visit answers were reviewed by a board certified advanced clinical practitioner to complete your personal care plan. Depending upon the condition, your plan could have included both over the counter or prescription medications.  Please review your pharmacy choice. Make sure the pharmacy is open so you can pick up prescription now. If there is a problem, you may contact your provider through [employer] and have the prescription routed to another pharmacy.  Your safety is important to [place]. If you have drug allergies check your prescription carefully.   For the next 24 hours you  can use MyChart to ask questions about today's visit, request a non-urgent call back, or ask for a work or school excuse. You will get an email in the next two days asking about your experience. I hope that your e-visit has been valuable and will speed your recovery.

## 2023-11-07 NOTE — Progress Notes (Signed)
 I have spent 5 minutes in review of e-visit questionnaire, review and updating patient chart, medical decision making and response to patient.   Piedad Climes, PA-C

## 2023-12-24 DIAGNOSIS — R319 Hematuria, unspecified: Secondary | ICD-10-CM | POA: Diagnosis not present

## 2023-12-24 DIAGNOSIS — Z13228 Encounter for screening for other metabolic disorders: Secondary | ICD-10-CM | POA: Diagnosis not present

## 2023-12-24 DIAGNOSIS — Z1231 Encounter for screening mammogram for malignant neoplasm of breast: Secondary | ICD-10-CM | POA: Diagnosis not present

## 2023-12-24 DIAGNOSIS — Z13220 Encounter for screening for lipoid disorders: Secondary | ICD-10-CM | POA: Diagnosis not present

## 2023-12-24 DIAGNOSIS — Z1329 Encounter for screening for other suspected endocrine disorder: Secondary | ICD-10-CM | POA: Diagnosis not present

## 2023-12-24 DIAGNOSIS — Z6835 Body mass index (BMI) 35.0-35.9, adult: Secondary | ICD-10-CM | POA: Diagnosis not present

## 2023-12-24 DIAGNOSIS — Z1321 Encounter for screening for nutritional disorder: Secondary | ICD-10-CM | POA: Diagnosis not present

## 2023-12-24 DIAGNOSIS — Z131 Encounter for screening for diabetes mellitus: Secondary | ICD-10-CM | POA: Diagnosis not present

## 2023-12-24 DIAGNOSIS — Z01419 Encounter for gynecological examination (general) (routine) without abnormal findings: Secondary | ICD-10-CM | POA: Diagnosis not present

## 2024-03-10 DIAGNOSIS — Z87448 Personal history of other diseases of urinary system: Secondary | ICD-10-CM | POA: Diagnosis not present

## 2024-06-05 ENCOUNTER — Other Ambulatory Visit (HOSPITAL_COMMUNITY): Payer: Self-pay

## 2024-06-05 ENCOUNTER — Telehealth: Admitting: Family Medicine

## 2024-06-05 DIAGNOSIS — R3989 Other symptoms and signs involving the genitourinary system: Secondary | ICD-10-CM | POA: Diagnosis not present

## 2024-06-05 MED ORDER — SULFAMETHOXAZOLE-TRIMETHOPRIM 800-160 MG PO TABS
1.0000 | ORAL_TABLET | Freq: Two times a day (BID) | ORAL | 0 refills | Status: AC
  Filled 2024-06-05: qty 10, 5d supply, fill #0

## 2024-06-05 NOTE — Progress Notes (Signed)
